# Patient Record
Sex: Female | Born: 1995 | Hispanic: No | Marital: Single | Smoking: Never smoker
Health system: Southern US, Community
[De-identification: ages and names within clinical notes are randomized; demographics above are authoritative.]

---
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
## 2021-09-28 ENCOUNTER — Ambulatory Visit (INDEPENDENT_AMBULATORY_CARE_PROVIDER_SITE_OTHER): Payer: 59

## 2021-09-28 ENCOUNTER — Ambulatory Visit
Admission: EM | Admit: 2021-09-28 | Discharge: 2021-09-28 | Disposition: A | Discharge status: Home / Self Care | Payer: 59 | Attending: Emergency Medicine | Admitting: Emergency Medicine

## 2021-09-28 DIAGNOSIS — R1030 Lower abdominal pain, unspecified: Secondary | ICD-10-CM

## 2021-09-28 DIAGNOSIS — K5900 Constipation, unspecified: Secondary | ICD-10-CM

## 2021-09-28 LAB — POCT URINALYSIS DIP (MANUAL ENTRY)
Blood, UA: NEGATIVE
Glucose, UA: NEGATIVE mg/dL
Leukocytes, UA: NEGATIVE
Nitrite, UA: NEGATIVE
Protein Ur, POC: 30 mg/dL — AB
Spec Grav, UA: 1.025 (ref 1.010–1.025)
Urobilinogen, UA: 1 E.U./dL
pH, UA: 7 (ref 5.0–8.0)

## 2021-09-28 LAB — POCT URINE PREGNANCY: Preg Test, Ur: NEGATIVE

## 2021-09-28 NOTE — Discharge Instructions (Addendum)
Your x-ray image today is concerning for constipation.  I believe that your stool has not been moving as freely as it should so I recommend the following bowel cleanout treatment.  Please purchase 32 ounces of your favorite flavor of Gatorade and mix in 250 g of MiraLAX, also known as polyethylene glycol.  It is not important to buy the brand name, the generic version will do.  When you have mixed them together and the MiraLAX powder is completely dissolved, please consume the entire 32 ounces within 1 hour.  Within a few hours, perhaps a little more or a little less, you should feel a significant urge to move your bowels and be able to produce a significant amount of stool.  If you feel you have had significant production of stool there is no need to repeat this process but if you feel that your results have been less than satisfying, you can certainly repeat this process 6 hours after the first round.  If you have worsening pain after this treatment, recommend that she go to the emergency room for further evaluation.  More detailed imaging may necessary such as abdominal ultrasound or CT scan.  Thank you for visiting urgent care today.

## 2021-09-28 NOTE — ED Triage Notes (Signed)
Pt c/o lower abd pain x4 days. The patient states she has been having nausea and vomiting.  Home interventions: tylenol

## 2021-09-28 NOTE — ED Provider Notes (Signed)
[facility]    CSN: [id number] Arrival date & time: 09/28/21  1007    HISTORY   Chief Complaint  Patient presents with   Abdominal Pain   Nausea   Vomiting   HPI Danielle Lara is a pleasant, 25 y.o. female who presents to urgent care today. Patient is here with mom today.  Pt c/o bilateral lower abd pain x4 days. The patient states she has been feeling nauseated and has vomited once earlier today.  Patient states she has been taking Tylenol without relief of her symptoms.  Patient reports a history of constipation but states she moves her bowels regularly.  Patient states that her stool has been noticeably smaller than usual.  Patient denies diarrhea, fever, focal abdominal pain, fever, body aches, chills.  The history is provided by the patient.   History reviewed. No pertinent past medical history. There are no problems to display for this patient.  History reviewed. No pertinent surgical history. OB History   No obstetric history on file.    Home Medications    Prior to Admission medications   Medication Sig Start Date End Date Taking? Authorizing Provider  acetaminophen (TYLENOL) 500 MG tablet Take 500 mg by mouth every 6 (six) hours as needed. For pain.    [provider]  Multiple Vitamin (MULTIVITAMIN WITH MINERALS) TABS Take 1 tablet by mouth daily.    [provider]  ondansetron (ZOFRAN-ODT) 4 MG disintegrating tablet Take 1 tablet (4 mg total) by mouth every 8 (eight) hours as needed for nausea. 11/20/11   Quita Skye, MD    Family History History reviewed. No pertinent family history. Social History Social History   Tobacco Use   Smoking status: Never  Vaping Use   Vaping Use: Never used  Substance Use Topics   Alcohol use: Not Currently   Drug use: Never   Allergies   Patient has no known allergies.  Review of Systems Review of Systems Pertinent findings revealed after performing a 14 point review of systems has  been noted in the history of present illness.  Physical Exam Triage Vital Signs ED Triage Vitals  Enc Vitals Group     BP 11/29/20 0827 (!) 147/82     Pulse Rate 11/29/20 0827 72     Resp 11/29/20 0827 18     Temp 11/29/20 0827 98.3 F (36.8 C)     Temp Source 11/29/20 0827 Oral     SpO2 11/29/20 0827 98 %     Weight --      Height --      Head Circumference --      Peak Flow --      Pain Score 11/29/20 0826 5     Pain Loc --      Pain Edu? --      Excl. in GC? --   No data found.  Updated Vital Signs BP 101/68 (BP Location: Right Arm)   Pulse 74   Temp 98.9 F (37.2 C) (Oral)   Resp 16   LMP 08/27/2021 (Approximate)   SpO2 98%   Physical Exam Vitals and nursing note reviewed.  Constitutional:      General: She is not in acute distress.    Appearance: Normal appearance. She is not ill-appearing.  HENT:     Head: Normocephalic and atraumatic.  Eyes:     General: Lids are normal.        Right eye: No discharge.  Left eye: No discharge.     Extraocular Movements: Extraocular movements intact.     Conjunctiva/sclera: Conjunctivae normal.     Right eye: Right conjunctiva is not injected.     Left eye: Left conjunctiva is not injected.  Neck:     Trachea: Trachea and phonation normal.  Cardiovascular:     Rate and Rhythm: Normal rate and regular rhythm.     Pulses: Normal pulses.     Heart sounds: Normal heart sounds. No murmur heard.    No friction rub. No gallop.  Pulmonary:     Effort: Pulmonary effort is normal. No accessory muscle usage, prolonged expiration or respiratory distress.     Breath sounds: Normal breath sounds. No stridor, decreased air movement or transmitted upper airway sounds. No decreased breath sounds, wheezing, rhonchi or rales.  Chest:     Chest wall: No tenderness.  Abdominal:     General: Abdomen is flat. Bowel sounds are decreased. There is no distension.     Palpations: Abdomen is soft. There is mass (Palpable stool in lower  abdomen bilaterally).     Tenderness: There is abdominal tenderness in the right lower quadrant, periumbilical area, suprapubic area and left lower quadrant. There is no right CVA tenderness, left CVA tenderness, guarding or rebound. Negative signs include Murphy's sign, Rovsing's sign, McBurney's sign, psoas sign and obturator sign.     Hernia: No hernia is present.  Musculoskeletal:        General: Normal range of motion.     Cervical back: Normal range of motion and neck supple. Normal range of motion.  Lymphadenopathy:     Cervical: No cervical adenopathy.  Skin:    General: Skin is warm and dry.     Findings: No erythema or rash.  Neurological:     General: No focal deficit present.     Mental Status: She is alert and oriented to person, place, and time.  Psychiatric:        Mood and Affect: Mood normal.        Behavior: Behavior normal.     Visual Acuity Right Eye Distance:   Left Eye Distance:   Bilateral Distance:    Right Eye Near:   Left Eye Near:    Bilateral Near:     [facility] / Diagnostics / Procedures:     Radiology DG Abd 1 View  Result Date: 09/28/2021 CLINICAL DATA:  Lower abdominal pain nausea.  Constipation. EXAM: ABDOMEN - 1 VIEW COMPARISON:  None Available. FINDINGS: The bowel gas pattern is normal.  No significant stool burden seen. IMPRESSION: Negative. Electronically Signed   By: Danae Orleans M.D.   On: 09/28/2021 11:34    Procedures Procedures (including critical care time) EKG  Pending results:  Labs Reviewed  POCT URINALYSIS DIP (MANUAL ENTRY) - Abnormal; Notable for the following components:      Result Value   Bilirubin, UA small (*)    Ketones, POC UA large (80) (*)    Protein Ur, POC =30 (*)    All other components within normal limits  POCT URINE PREGNANCY    Medications Ordered in UC: Medications - No data to display  UC Diagnoses / Final Clinical Impressions(s)   I have reviewed the triage vital signs and the nursing  notes.  Pertinent labs & imaging results that were available during my care of the patient were reviewed by me and considered in my medical decision making (see chart for details).    Final  diagnoses:  Constipation, unspecified constipation type   Images of abdominal x-ray were shared with patient.  Urinalysis today was unremarkable.  Patient was advised to use MiraLAX and Gatorade for bowel cleanout and to consider taking 1 capful daily for prevention.  Return precautions advised.  Emergency precautions advised.  ED Prescriptions   None    PDMP not reviewed this encounter.  Pending results:  Labs Reviewed  POCT URINALYSIS DIP (MANUAL ENTRY) - Abnormal; Notable for the following components:      Result Value   Bilirubin, UA small (*)    Ketones, POC UA large (80) (*)    Protein Ur, POC =30 (*)    All other components within normal limits  POCT URINE PREGNANCY    Discharge Instructions:   Discharge Instructions      Your x-ray image today is concerning for constipation.  I believe that your stool has not been moving as freely as it should so I recommend the following bowel cleanout treatment.  Purchase 32 ounces of your favorite flavor of Gatorade and mix in 250 g of MiraLAX, also known as polyethylene glycol.  It is not important to buy the brand name, the generic version will do.  When she had mixed these 2 together and dissolved the MiraLAX, please consume the entire 32 ounces within 1 hour.  In a few hours, perhaps a little more or little less, you should feel a significant urge to move your bowels.  If you feel you have had significant production of stool there is no need to repeat this process but if you feel that your results have been less than satisfying, you can certainly repeat this process 6 hours after the first round.  If you have worsening pain after this treatment, recommend that she go to the emergency room for further evaluation.  More detailed imaging may  necessary such as abdominal ultrasound or CT scan.  Thank you for visiting urgent care today.    Disposition Upon Discharge:  Condition: stable for discharge home  Patient presented with an acute illness with associated systemic symptoms and significant discomfort requiring urgent management. In my opinion, this is a condition that a prudent lay person (someone who possesses an average knowledge of health and medicine) may potentially expect to result in complications if not addressed urgently such as respiratory distress, impairment of bodily function or dysfunction of bodily organs.   Routine symptom specific, illness specific and/or disease specific instructions were discussed with the patient and/or caregiver at length.   As such, the patient has been evaluated and assessed, work-up was performed and treatment was provided in alignment with urgent care protocols and evidence based medicine.  Patient/parent/caregiver has been advised that the patient may require follow up for further testing and treatment if the symptoms continue in spite of treatment, as clinically indicated and appropriate.  Patient/parent/caregiver has been advised to return to the [facility] or PCP if no better; to PCP or the Emergency Department if new signs and symptoms develop, or if the current signs or symptoms continue to change or worsen for further workup, evaluation and treatment as clinically indicated and appropriate  The patient will follow up with their current PCP if and as advised. If the patient does not currently have a PCP we will assist them in obtaining one.   The patient may need specialty follow up if the symptoms continue, in spite of conservative treatment and management, for further workup, evaluation, consultation and treatment as clinically indicated and  appropriate.   Patient/parent/caregiver verbalized understanding and agreement of plan as discussed.  All questions were addressed during visit.   Please see discharge instructions below for further details of plan.  This office note has been dictated using Teaching laboratory technician.  Unfortunately, this method of dictation can sometimes lead to typographical or grammatical errors.  I apologize for your inconvenience in advance if this occurs.  Please do not hesitate to reach out to me if clarification is needed.      Theadora Rama Scales, PA-C 09/28/21 1431

## 2021-09-29 ENCOUNTER — Other Ambulatory Visit: Payer: Self-pay

## 2021-09-29 ENCOUNTER — Encounter (HOSPITAL_COMMUNITY): Payer: Self-pay

## 2021-09-29 ENCOUNTER — Emergency Department (HOSPITAL_COMMUNITY): Payer: 59

## 2021-09-29 ENCOUNTER — Emergency Department (HOSPITAL_COMMUNITY)
Admission: EM | Admit: 2021-09-29 | Discharge: 2021-09-29 | Disposition: A | Discharge status: Home / Self Care | Payer: 59 | Attending: Emergency Medicine | Admitting: Emergency Medicine

## 2021-09-29 DIAGNOSIS — R1033 Periumbilical pain: Secondary | ICD-10-CM | POA: Diagnosis not present

## 2021-09-29 DIAGNOSIS — R1030 Lower abdominal pain, unspecified: Secondary | ICD-10-CM

## 2021-09-29 DIAGNOSIS — R1011 Right upper quadrant pain: Secondary | ICD-10-CM | POA: Diagnosis not present

## 2021-09-29 DIAGNOSIS — R112 Nausea with vomiting, unspecified: Secondary | ICD-10-CM | POA: Insufficient documentation

## 2021-09-29 DIAGNOSIS — R300 Dysuria: Secondary | ICD-10-CM | POA: Diagnosis not present

## 2021-09-29 LAB — URINALYSIS, ROUTINE W REFLEX MICROSCOPIC
Bilirubin Urine: NEGATIVE
Glucose, UA: NEGATIVE mg/dL
Hgb urine dipstick: NEGATIVE
Ketones, ur: 80 mg/dL — AB
Leukocytes,Ua: NEGATIVE
Nitrite: NEGATIVE
Protein, ur: 30 mg/dL — AB
Specific Gravity, Urine: 1.027 (ref 1.005–1.030)
pH: 8 (ref 5.0–8.0)

## 2021-09-29 LAB — I-STAT BETA HCG BLOOD, ED (MC, WL, AP ONLY): I-stat hCG, quantitative: <5 m[IU]/mL (ref <5)

## 2021-09-29 LAB — CBC
HCT: 37.7 % (ref 36.0–46.0)
Hemoglobin: 13.3 g/dL (ref 12.0–15.0)
MCH: 32.4 pg (ref 26.0–34.0)
MCHC: 35.3 g/dL (ref 30.0–36.0)
MCV: 91.7 fL (ref 80.0–100.0)
Platelets: 296 10*3/uL (ref 150–400)
RBC: 4.11 MIL/uL (ref 3.87–5.11)
RDW: 11.5 % (ref 11.5–15.5)
WBC: 5.8 10*3/uL (ref 4.0–10.5)
nRBC: 0 % (ref 0.0–0.2)

## 2021-09-29 LAB — COMPREHENSIVE METABOLIC PANEL
ALT: 14 U/L (ref 0–44)
AST: 22 U/L (ref 15–41)
Albumin: 4.3 g/dL (ref 3.5–5.0)
Alkaline Phosphatase: 43 U/L (ref 38–126)
Anion gap: 12 (ref 5–15)
BUN: 9 mg/dL (ref 6–20)
CO2: 20 mmol/L — ABNORMAL LOW (ref 22–32)
Calcium: 9.9 mg/dL (ref 8.9–10.3)
Chloride: 105 mmol/L (ref 98–111)
Creatinine, Ser: 0.81 mg/dL (ref 0.44–1.00)
GFR, Estimated: >60 mL/min (ref >60)
Glucose, Bld: 118 mg/dL — ABNORMAL HIGH (ref 70–99)
Potassium: 4.2 mmol/L (ref 3.5–5.1)
Sodium: 137 mmol/L (ref 135–145)
Total Bilirubin: 0.7 mg/dL (ref 0.3–1.2)
Total Protein: 7.5 g/dL (ref 6.5–8.1)

## 2021-09-29 LAB — RAPID URINE DRUG SCREEN, HOSP PERFORMED
Amphetamines: NOT DETECTED
Barbiturates: NOT DETECTED
Benzodiazepines: NOT DETECTED
Cocaine: NOT DETECTED
Opiates: NOT DETECTED
Tetrahydrocannabinol: NOT DETECTED

## 2021-09-29 LAB — LIPASE, BLOOD: Lipase: 22 U/L (ref 11–51)

## 2021-09-29 MED ORDER — IOHEXOL 300 MG/ML  SOLN
100.0000 mL | Freq: Once | INTRAMUSCULAR | Status: AC | PRN
  Administered 2021-09-29: 100 mL via INTRAVENOUS

## 2021-09-29 MED ORDER — ONDANSETRON 4 MG PO TBDP
ORAL_TABLET | ORAL | Status: AC
  Filled 2021-09-29: qty 1

## 2021-09-29 MED ORDER — ONDANSETRON 4 MG PO TBDP
4.0000 mg | ORAL_TABLET | Freq: Once | ORAL | Status: AC | PRN
  Administered 2021-09-29: 4 mg via ORAL

## 2021-09-29 MED ORDER — LACTATED RINGERS IV BOLUS
1000.0000 mL | Freq: Once | INTRAVENOUS | Status: AC
  Administered 2021-09-29: 1000 mL via INTRAVENOUS

## 2021-09-29 MED ORDER — ONDANSETRON HCL 4 MG PO TABS: 4.0000 mg | ORAL_TABLET | Freq: Three times a day (TID) | ORAL | 0 refills | Status: AC | PRN

## 2021-09-29 MED ORDER — KETOROLAC TROMETHAMINE 15 MG/ML IJ SOLN
15.0000 mg | Freq: Once | INTRAMUSCULAR | Status: AC
  Administered 2021-09-29: 15 mg via INTRAVENOUS
  Filled 2021-09-29: qty 1

## 2021-09-29 NOTE — ED Notes (Signed)
Provider notified of IV team consult patient has had x4 unsuccessful IV sticks due to difficult vasculature

## 2021-09-29 NOTE — Discharge Instructions (Addendum)
Use miralax daily to soften your stools. Use Zofran as needed needed for nausea/vomiting.  Well-hydrated.  Follow-up with your primary doctor.

## 2021-09-29 NOTE — ED Notes (Signed)
Patient transported to CT 

## 2021-09-29 NOTE — ED Triage Notes (Signed)
Patient went to [facility] yesteday and was dx with constipation but patient has been having abd pain n/v and unable to keep any fluids down.  Patient tried suppository with no relief.

## 2021-09-29 NOTE — ED Notes (Signed)
Patient reminded of the need for urine at this time

## 2021-09-29 NOTE — ED Notes (Signed)
Provider at bedside

## 2021-09-29 NOTE — ED Notes (Signed)
Unsuccessful IV stick x2 by this RN Reba RN at bedside to initiate IV

## 2021-09-29 NOTE — ED Provider Notes (Signed)
[facility] EMERGENCY DEPARTMENT Provider Note   CSN: [id number] Arrival date & time: 09/29/21  3664     History {Add pertinent medical, surgical, social history, OB history to HPI:1} Chief Complaint  Patient presents with   Abdominal Pain    Danielle Lara is a 26 y.o. female with no significant PMH presenting with lower abdominal pain x 5 days. Located in lower mid abdomen. Associated with N/V and decreased po intake. Unable to tolerate po past 2 days.  This is nonbloody and nonbilious.  Denies fever.  Denies known sick contacts.  Seen at urgent care yesterday and diagnosed with constipation.  She tried an enema yesterday with small amount of output.  Last menstrual cycle 1 month ago and reported as normal.  However patient has irregular periods at baseline.  She is not on birth control.  She denies any urinary symptoms.  She reports baseline vaginal discharge, that is thin and clear.   Abdominal Pain Associated symptoms: dysuria, nausea and vomiting   Associated symptoms: no vaginal bleeding        Home Medications Prior to Admission medications   Medication Sig Start Date End Date Taking? Authorizing Provider  acetaminophen (TYLENOL) 500 MG tablet Take 500 mg by mouth every 6 (six) hours as needed. For pain.    [provider]  Multiple Vitamin (MULTIVITAMIN WITH MINERALS) TABS Take 1 tablet by mouth daily.    [provider]  ondansetron (ZOFRAN-ODT) 4 MG disintegrating tablet Take 1 tablet (4 mg total) by mouth every 8 (eight) hours as needed for nausea. 11/20/11   Quita Skye, MD      Allergies    Patient has no known allergies.    Review of Systems   Review of Systems  Gastrointestinal:  Positive for abdominal pain, nausea and vomiting. Negative for abdominal distention.  Genitourinary:  Positive for dysuria, flank pain and frequency. Negative for decreased urine volume, urgency, vaginal bleeding and vaginal pain.    Physical  Exam Updated Vital Signs BP 117/70 (BP Location: Right Arm)   Pulse (!) 53   Temp 99.6 F (37.6 C) (Oral)   Resp 12   Ht 5\' 3"  (1.6 m)   Wt 49.4 kg   LMP 08/27/2021 (Approximate)   SpO2 100%   BMI 19.31 kg/m  Physical Exam Vitals and nursing note reviewed.  Constitutional:      General: She is not in acute distress.    Appearance: She is well-developed.  HENT:     Head: Normocephalic and atraumatic.  Eyes:     Conjunctiva/sclera: Conjunctivae normal.  Cardiovascular:     Rate and Rhythm: Normal rate and regular rhythm.     Heart sounds: No murmur heard. Pulmonary:     Effort: Pulmonary effort is normal. No respiratory distress.     Breath sounds: Normal breath sounds.  Abdominal:     Palpations: Abdomen is soft.     Tenderness: There is abdominal tenderness in the right upper quadrant, periumbilical area and suprapubic area. There is no right CVA tenderness or left CVA tenderness. Negative signs include Murphy's sign and McBurney's sign.  Musculoskeletal:        General: No swelling.     Cervical back: Neck supple.  Skin:    General: Skin is warm and dry.     Capillary Refill: Capillary refill takes less than 2 seconds.  Neurological:     Mental Status: She is alert.  Psychiatric:  Mood and Affect: Mood normal.     ED Results / Procedures / Treatments   Labs (all labs ordered are listed, but only abnormal results are displayed) Labs Reviewed  COMPREHENSIVE METABOLIC PANEL - Abnormal; Notable for the following components:      Result Value   CO2 20 (*)    Glucose, Bld 118 (*)    All other components within normal limits  URINALYSIS, ROUTINE W REFLEX MICROSCOPIC - Abnormal; Notable for the following components:   APPearance HAZY (*)    Ketones, ur 80 (*)    Protein, ur 30 (*)    Bacteria, UA MANY (*)    All other components within normal limits  LIPASE, BLOOD  CBC  I-STAT BETA HCG BLOOD, ED ([facility], [facility], [facility] ONLY)    EKG None  Radiology DG Abd 1  View  Result Date: 09/28/2021 CLINICAL DATA:  Lower abdominal pain nausea.  Constipation. EXAM: ABDOMEN - 1 VIEW COMPARISON:  None Available. FINDINGS: The bowel gas pattern is normal.  No significant stool burden seen. IMPRESSION: Negative. Electronically Signed   By: Danae Orleans M.D.   On: 09/28/2021 11:34    Procedures Procedures  {Document cardiac monitor, telemetry assessment procedure when appropriate:1}  Medications Ordered in ED Medications  lactated ringers bolus 1,000 mL (has no administration in time range)  ketorolac (TORADOL) 15 MG/ML injection 15 mg (has no administration in time range)  ondansetron (ZOFRAN-ODT) disintegrating tablet 4 mg (4 mg Oral Given 09/29/21 0844)    ED Course/ Medical Decision Making/ A&P                           Medical Decision Making Amount and/or Complexity of Data Reviewed Labs: ordered. Radiology: ordered.  Risk Prescription drug management.   ***  26 year old female with lower abdominal pain x5 days with associated nausea and vomiting. On arrival, has stable vital signs.  Afebrile.  Physical exam significant for suprapubic pain and mild right upper quadrant tenderness, with a negative Murphy sign. Seen in urgent care yesterday and had an unremarkable abdominal x-ray that did not show any significant stool burden. Differential diagnosis includes gastroenteritis, UTI, pyelonephritis, appendicitis, cholecystitis, pregnancy, ovarian cyst.  Ovarian torsion considered, but thought to be less likely as patient has had symptoms for 5 days and her pain is in the center of her lower abdomen and she does not have any right lower quadrant or left lower quadrant tenderness.  Patient given Zofran in triage, and then additional IV fluids and Toradol on my evaluation.  Labs reviewed and found to be reassuring.  Patient does have ketonuria, which is consistent with her reported lack of p.o. intake.  Urinalysis without signs of infection.  Pregnancy  negative.  No AKI or significant lecture light abnormalities, leukocytosis, or anemia. CT scan ***  {Document critical care time when appropriate:1} {Document review of labs and clinical decision tools ie heart score, Chads2Vasc2 etc:1}  {Document your independent review of radiology images, and any outside records:1} {Document your discussion with family members, caretakers, and with consultants:1} {Document social determinants of health affecting pt's care:1} {Document your decision making why or why not admission, treatments were needed:1} Final Clinical Impression(s) / ED Diagnoses Final diagnoses:  None    Rx / DC Orders ED Discharge Orders     None
